# Patient Record
Sex: Female | Born: 1995 | Race: White | Hispanic: No | Marital: Single | Smoking: Never smoker
Health system: Southern US, Community
[De-identification: ages and names within clinical notes are randomized; demographics above are authoritative.]

## PROBLEM LIST (undated history)

## (undated) DIAGNOSIS — L509 Urticaria, unspecified: Secondary | ICD-10-CM

## (undated) HISTORY — DX: Urticaria, unspecified: L50.9

## (undated) HISTORY — PX: FINGER SURGERY: SHX640

---

## 2014-03-14 ENCOUNTER — Ambulatory Visit: Payer: Self-pay | Admitting: Gastroenterology

## 2014-03-31 ENCOUNTER — Ambulatory Visit: Payer: Self-pay | Admitting: Gastroenterology

## 2015-02-05 ENCOUNTER — Ambulatory Visit: Payer: Self-pay | Admitting: Family Medicine

## 2015-02-15 ENCOUNTER — Ambulatory Visit: Admit: 2015-02-15 | Disposition: A | Discharge status: Home / Self Care | Payer: Self-pay | Admitting: Family Medicine

## 2015-08-18 ENCOUNTER — Emergency Department (HOSPITAL_COMMUNITY): Admission: EM | Admit: 2015-08-18 | Discharge: 2015-08-18 | Discharge status: Absent without leave | Payer: Self-pay

## 2015-08-18 ENCOUNTER — Encounter (HOSPITAL_COMMUNITY): Payer: Self-pay | Admitting: Emergency Medicine

## 2015-08-18 ENCOUNTER — Emergency Department (HOSPITAL_COMMUNITY)
Admission: EM | Admit: 2015-08-18 | Discharge: 2015-08-18 | Disposition: A | Discharge status: Home / Self Care | Payer: BLUE CROSS/BLUE SHIELD | Attending: Emergency Medicine | Admitting: Emergency Medicine

## 2015-08-18 DIAGNOSIS — X58XXXA Exposure to other specified factors, initial encounter: Secondary | ICD-10-CM | POA: Diagnosis not present

## 2015-08-18 DIAGNOSIS — Z79899 Other long term (current) drug therapy: Secondary | ICD-10-CM | POA: Diagnosis not present

## 2015-08-18 DIAGNOSIS — Y9289 Other specified places as the place of occurrence of the external cause: Secondary | ICD-10-CM | POA: Diagnosis not present

## 2015-08-18 DIAGNOSIS — R0602 Shortness of breath: Secondary | ICD-10-CM | POA: Insufficient documentation

## 2015-08-18 DIAGNOSIS — R21 Rash and other nonspecific skin eruption: Secondary | ICD-10-CM | POA: Diagnosis present

## 2015-08-18 DIAGNOSIS — Y998 Other external cause status: Secondary | ICD-10-CM | POA: Diagnosis not present

## 2015-08-18 DIAGNOSIS — Y9389 Activity, other specified: Secondary | ICD-10-CM | POA: Diagnosis not present

## 2015-08-18 DIAGNOSIS — T7840XA Allergy, unspecified, initial encounter: Secondary | ICD-10-CM | POA: Diagnosis not present

## 2015-08-18 MED ORDER — PREDNISONE 20 MG PO TABS: ORAL_TABLET | ORAL | Status: AC

## 2015-08-18 MED ORDER — EPINEPHRINE 0.3 MG/0.3ML IJ SOAJ: 0.3000 mg | Freq: Once | INTRAMUSCULAR | Status: AC

## 2015-08-18 NOTE — ED Provider Notes (Signed)
CSN: [id number]     Arrival date & time 08/18/15  1323 History   First MD Initiated Contact with Patient 08/18/15 1458     Chief Complaint  Patient presents with  . Allergic Reaction     (Consider location/radiation/quality/duration/timing/severity/associated sxs/prior Treatment) HPI Comments: The ER as a transfer from urgent care for allergic reaction. Patient reports that she started to experience rash 5 days ago. Over the next couple of days the rash spread and worsened. She was seen at urgent care this morning for the symptoms and had sudden worsening. She started to have tightness in her chest and difficulty breathing. She was treated with Benadryl and dexamethasone approximately 3 hours ago. Symptoms are significantly improved.  Patient is a 19 y.o. female presenting with allergic reaction.  Allergic Reaction Presenting symptoms: rash     History reviewed. No pertinent past medical history. History reviewed. No pertinent past surgical history. No family history on file. Social History  Substance Use Topics  . Smoking status: Never Smoker   . Smokeless tobacco: None  . Alcohol Use: Yes     Comment: socially   OB History    No data available     Review of Systems  Respiratory: Positive for shortness of breath.   Skin: Positive for rash.  All other systems reviewed and are negative.     Allergies  Review of patient's allergies indicates no known allergies.  Home Medications   Prior to Admission medications   Medication Sig Start Date End Date Taking? Authorizing Provider  diphenhydrAMINE (BENADRYL) 25 mg capsule Take 25 mg by mouth every 6 (six) hours as needed for itching or allergies.   Yes Historical Provider, MD  escitalopram (LEXAPRO) 10 MG tablet Take 10 mg by mouth daily. 08/07/15  Yes Historical Provider, MD  IRON PO Take 1 tablet by mouth daily.   Yes Historical Provider, MD  loratadine (CLARITIN) 10 MG tablet Take 10 mg by mouth daily as needed for  allergies.   Yes Historical Provider, MD  LOW-OGESTREL 0.3-30 MG-MCG tablet Take 1 tablet by mouth daily. 06/21/15  Yes Historical Provider, MD  Probiotic Product (PROBIOTIC DAILY) CAPS Take 1 tablet by mouth daily.   Yes Historical Provider, MD  spironolactone (ALDACTONE) 100 MG tablet Take 1 tablet by mouth daily. 08/08/15  Yes Historical Provider, MD   BP 119/73 mmHg  Pulse 53  Temp(Src) 98.5 F (36.9 C) (Oral)  Resp 18  SpO2 98%  LMP 08/04/2015 (Approximate) Physical Exam  Constitutional: She is oriented to person, place, and time. She appears well-developed and well-nourished. No distress.  HENT:  Head: Normocephalic and atraumatic.  Right Ear: Hearing normal.  Left Ear: Hearing normal.  Nose: Nose normal.  Mouth/Throat: Oropharynx is clear and moist and mucous membranes are normal.  Eyes: Conjunctivae and EOM are normal. Pupils are equal, round, and reactive to light.  Neck: Normal range of motion. Neck supple.  Cardiovascular: Regular rhythm, S1 normal and S2 normal.  Exam reveals no gallop and no friction rub.   No murmur heard. Pulmonary/Chest: Effort normal and breath sounds normal. No respiratory distress. She exhibits no tenderness.  Abdominal: Soft. Normal appearance and bowel sounds are normal. There is no hepatosplenomegaly. There is no tenderness. There is no rebound, no guarding, no tenderness at McBurney's point and negative Murphy's sign. No hernia.  Musculoskeletal: Normal range of motion.  Neurological: She is alert and oriented to person, place, and time. She has normal strength. No cranial nerve deficit or sensory deficit.  Coordination normal. GCS eye subscore is 4. GCS verbal subscore is 5. GCS motor subscore is 6.  Skin: Skin is warm, dry and intact. Rash (diffuse erythematous rash) noted. No cyanosis.  Psychiatric: She has a normal mood and affect. Her speech is normal and behavior is normal. Thought content normal.  Nursing note and vitals reviewed.   ED  Course  Procedures (including critical care time) Labs Review Labs Reviewed - No data to display  Imaging Review No results found. I have personally reviewed and evaluated these images and lab results as part of my medical decision-making.   EKG Interpretation None      MDM   Final diagnoses:  None  allergic reaction  Presents to the emergency department for evaluation of allergic reaction. Patient reports that symptoms have been ongoing intermittent all week. It started after eating mango, has never had a reaction before. She describes hives intermittently over the week, worsening of symptoms this morning. This was treated at urgent care with Benadryl and Decadron. She has a nonspecific rash currently does not resemble urticaria. Symptoms definitely consistent with allergic reaction. Will continue prednisone taper. Benadryl scheduled for 2 days, then as needed. Return if symptoms worsen.    Gilda Crease, MD 08/18/15 [phone]

## 2015-08-18 NOTE — ED Notes (Signed)
Pt from UC after possible allergic reaction to mango. Pt was given 50 IM benadryl, 10 Dexamethasone at UC. Pt has hives and itching for a few days but continued to eat mango. Pt has no respiratory distress. Pt denies N/V. Pt is A&O, ambulatory w/o difficulty and in NAD

## 2015-08-18 NOTE — Discharge Instructions (Signed)
Epinephrine Injection Epinephrine is a medicine given by injection to temporarily treat an emergency allergic reaction. It is also used to treat severe asthmatic attacks and other lung problems. The medicine helps to enlarge (dilate) the small breathing tubes of the lungs. A life-threatening, sudden allergic reaction that involves the whole body is called anaphylaxis. Because of potential side effects, epinephrine should only be used as directed by your caregiver. RISKS AND COMPLICATIONS Possible side effects of epinephrine injections include:  Chest pain.  Irregular or rapid heartbeat.  Shortness of breath.  Nausea.  Vomiting.  Abdominal pain or cramping.  Sweating.  Dizziness.  Weakness.  Headache.  Nervousness. Report all side effects to your caregiver. HOW TO GIVE AN EPINEPHRINE INJECTION Give the epinephrine injection immediately when symptoms of a severe reaction begin. Inject the medicine into the outer thigh or any available, large muscle. Your caregiver can teach you how to do this. You do not need to remove any clothing. After the injection, call your local emergency services (911 in [place] Even if you improve after the injection, you need to be examined at a hospital emergency department. Epinephrine works quickly, but it also wears off quickly. Delayed reactions can occur. A delayed reaction may be as serious and dangerous as the initial reaction. HOME CARE INSTRUCTIONS  Make sure you and your family know how to give an epinephrine injection.  Use epinephrine injections as directed by your caregiver. Do not use this medicine more often or in larger doses than prescribed.  Always carry your epinephrine injection or anaphylaxis kit with you. This can be lifesaving if you have a severe reaction.  Store the medicine in a cool, dry place. If the medicine becomes discolored or cloudy, dispose of it properly and replace it with new medicine.  Check the expiration date on  your medicine. It may be unsafe to use medicines past their expiration date.  Tell your caregiver about any other medicines you are taking. Some medicines can react badly with epinephrine.  Tell your caregiver about any medical conditions you have, such as diabetes, high blood pressure (hypertension), heart disease, irregular heartbeats, or if you are pregnant. SEEK IMMEDIATE MEDICAL CARE IF:  You have used an epinephrine injection. Call your local emergency services (911 in [place] Even if you improve after the injection, you need to be examined at a hospital emergency department to make sure your allergic reaction is under control. You will also be monitored for adverse effects from the medicine.  You have chest pain.  You have irregular or fast heartbeats.  You have shortness of breath.  You have severe headaches.  You have severe nausea, vomiting, or abdominal cramps.  You have severe pain, swelling, or redness in the area where you gave the injection. Document Released: 11/14/2000 Document Revised: 02/09/2012 Document Reviewed: 08/06/2011 [facility] Patient Information 2015 [place], [place]. This information is not intended to replace advice given to you by your health care provider. Make sure you discuss any questions you have with your health care provider. Hives Hives are itchy, red, swollen areas of the skin. They can vary in size and location on your body. Hives can come and go for hours or several days (acute hives) or for several weeks (chronic hives). Hives do not spread from person to person (noncontagious). They may get worse with scratching, exercise, and emotional stress. CAUSES   Allergic reaction to food, additives, or drugs.  Infections, including the common cold.  Illness, such as vasculitis, lupus, or thyroid disease.  Exposure to sunlight, heat, or cold.  Exercise.  Stress.  Contact with chemicals. SYMPTOMS   Red or white swollen patches on the skin. The  patches may change size, shape, and location quickly and repeatedly.  Itching.  Swelling of the hands, feet, and face. This may occur if hives develop deeper in the skin. DIAGNOSIS  Your caregiver can usually tell what is wrong by performing a physical exam. Skin or blood tests may also be done to determine the cause of your hives. In some cases, the cause cannot be determined. TREATMENT  Mild cases usually get better with medicines such as antihistamines. Severe cases may require an emergency epinephrine injection. If the cause of your hives is known, treatment includes avoiding that trigger.  HOME CARE INSTRUCTIONS   Avoid causes that trigger your hives.  Take antihistamines as directed by your caregiver to reduce the severity of your hives. Non-sedating or low-sedating antihistamines are usually recommended. Do not drive while taking an antihistamine.  Take any other medicines prescribed for itching as directed by your caregiver.  Wear loose-fitting clothing.  Keep all follow-up appointments as directed by your caregiver. SEEK MEDICAL CARE IF:   You have persistent or severe itching that is not relieved with medicine.  You have painful or swollen joints. SEEK IMMEDIATE MEDICAL CARE IF:   You have a fever.  Your tongue or lips are swollen.  You have trouble breathing or swallowing.  You feel tightness in the throat or chest.  You have abdominal pain. These problems may be the first sign of a life-threatening allergic reaction. Call your local emergency services (911 in [place] MAKE SURE YOU:   Understand these instructions.  Will watch your condition.  Will get help right away if you are not doing well or get worse. Document Released: 11/17/2005 Document Revised: 11/22/2013 Document Reviewed: 02/10/2012 [facility] Patient Information 2015 [place], [place]. This information is not intended to replace advice given to you by your health care provider. Make sure you discuss any  questions you have with your health care provider.

## 2015-08-18 NOTE — ED Notes (Signed)
Bed: WA18 Expected date:  Expected time:  Means of arrival:  Comments: ems 

## 2015-08-28 ENCOUNTER — Ambulatory Visit: Payer: Self-pay | Admitting: Allergy and Immunology

## 2015-09-17 ENCOUNTER — Encounter: Payer: Self-pay | Admitting: Pediatrics

## 2015-09-17 ENCOUNTER — Ambulatory Visit (INDEPENDENT_AMBULATORY_CARE_PROVIDER_SITE_OTHER): Payer: BLUE CROSS/BLUE SHIELD | Admitting: Pediatrics

## 2015-09-17 VITALS — BP 110/64 | HR 64 | Temp 98.5°F | Resp 16 | Ht 66.54 in | Wt 148.8 lb

## 2015-09-17 DIAGNOSIS — T782XXA Anaphylactic shock, unspecified, initial encounter: Secondary | ICD-10-CM | POA: Insufficient documentation

## 2015-09-17 DIAGNOSIS — L500 Allergic urticaria: Secondary | ICD-10-CM | POA: Diagnosis not present

## 2015-09-17 DIAGNOSIS — J3089 Other allergic rhinitis: Secondary | ICD-10-CM | POA: Diagnosis not present

## 2015-09-17 MED ORDER — RANITIDINE HCL 150 MG PO TABS: 150.0000 mg | ORAL_TABLET | Freq: Two times a day (BID) | ORAL | Status: AC

## 2015-09-17 NOTE — Progress Notes (Signed)
NEW PATIENT NOTE  RE: Darlene Scott MRN: [id number] DOB: [birth year] [facility] [place] [address] [place] [address] Date of Office Visit: 09/17/2015  Assessment Referring provider: No referring provider defined for this encounter.  Chief Complaint: Allergic Reaction   HPI The patient is a 19 year old who developed hives one month ago. She used Benadryl. On September 17 of this year she developed hives and shortness of breath. She was treated with prednisone for 2 weeks and Benadryl. When she ran out of prednisone her hives returned. She has continued to have hives daily. The hives have not resolved with the use of Benadryl. At times she does have nasal congestion  She has been taking spironolactone for acne. For the past week she has been in a study looking at a supplemet called resveratrol  Review of Systems  Constitutional: Negative.   HENT: Negative.   Eyes: Negative.   Respiratory: Negative.   Cardiovascular: Negative.   Gastrointestinal: Negative.   Genitourinary: Negative.   Musculoskeletal: Negative.   Skin: Rash: urticaria since 08/11/2015 complicated by shortness of breath 1 week later.  Neurological: Negative.   Endo/Heme/Allergies: Negative.   Psychiatric/Behavioral: Negative.        She has ADD     Drug Allergies:  No Known Allergies  Physical Exam: BP 110/64 mmHg  Pulse 64  Temp(Src) 98.5 F (36.9 C)  Resp 16  Ht 5' 6.53" (1.69 m)  Wt 148 lb 13 oz (67.5 kg)  BMI 23.63 kg/m2  LMP 09/03/2015  Physical Exam  Constitutional: She is oriented to person, place, and time. She appears well-developed and well-nourished.  HENT:  Eyes normal. Ears normal. Nose normal. Pharynx normal.  Neck: Neck supple. No thyromegaly present.  Cardiovascular:  S1 and S2 normal no murmurs  Pulmonary/Chest:  Clear to percussion and auscultation  Abdominal: Soft.  No hepatosplenomegaly  Lymphadenopathy:     She has no cervical adenopathy.  Neurological: She is alert and oriented to person, place, and time.  Skin:  She had multiple hives. She also has dermographia  Psychiatric: She has a normal mood and affect. Her behavior is normal. Thought content normal.     Diagnostics:  Allergy skin testing did not show a significant reaction to a food. She had a positive skin test to dust mite   Assessment and Plan: 1. Anaphylaxis, initial encounter   2. Other allergic rhinitis   3. Allergic urticaria    Meds ordered this encounter  Medications  . ranitidine (ZANTAC) 150 MG tablet    Sig: Take 1 tablet (150 mg total) by mouth 2 (two) times daily.    Dispense:  60 tablet    Refill:  5   Patient Instructions  Cetirizine 10 mg twice a day for itching Ranitidine 150 mg twice a day She will see if foods that contain salicylates makes her itch If she has an allergic reaction she will take Benadryl 50 mg every 4 hours and if she has life-threatening symptoms she will inject herself with EpiPen 0.3 mg .She will then write what she had to eat or drink in the previous 4 hours If her symptoms get worse she will add prednisone 10 mg twice a day for 4 days 10 mg on the fifth day. She will let me know if she has to use prednisone.  Follow-up in 6 weeks but she will call me she's not doing well on this treatment plan.  Return in about 6 weeks (around 10/29/2015).   Thank you for the opportunity to care for this patient.  Please do not hesitate to contact me with questions.  [facility] [address] [place], [place] [id number] [phone]  J. Posey ReaA. Bardelas, M.D.

## 2015-09-17 NOTE — Patient Instructions (Addendum)
Cetirizine 10 mg twice a day for itching Ranitidine 150 mg twice a day She will see if foods that contain salicylates makes her itch If she has an allergic reaction she will take Benadryl 50 mg every 4 hours and if she has life-threatening symptoms she will inject herself with EpiPen 0.3 mg .She will then write what she had to eat or drink in the previous 4 hours If her symptoms get worse she will add prednisone 10 mg twice a day for 4 days 10 mg on the fifth day. She will let me know if she has to use prednisone.  Follow-up in 6 weeks but she will call me she's not doing well on this treatment plan.

## 2015-10-07 IMAGING — CR RIGHT TIBIA AND FIBULA - 2 VIEW
1 series · 4 of 4 positions shown · non-contrast
Comparison: None.

CLINICAL DATA: Leg pain for 5 weeks.

EXAM:
RIGHT TIBIA AND FIBULA - 2 VIEW

[Series 1: kdxr tibia and fibula rt(low leg · 0.14mm/px · 4 of 4 slices shown]
[im 1/4]
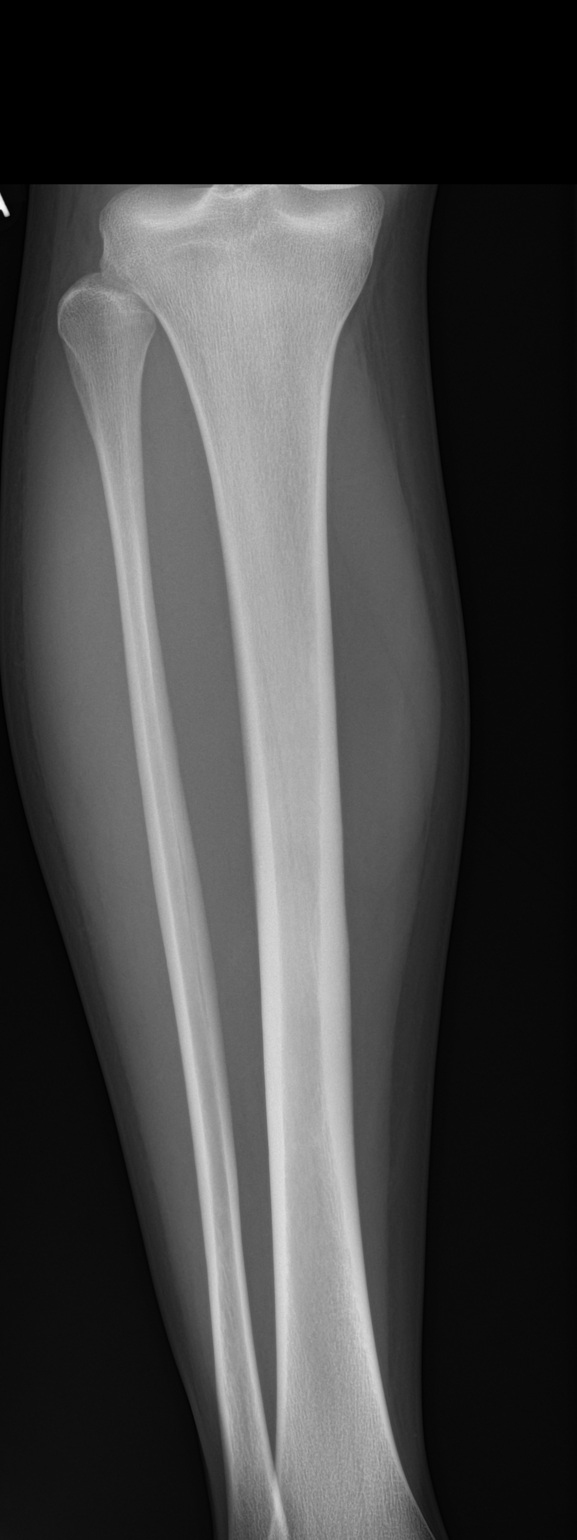
[im 2/4]
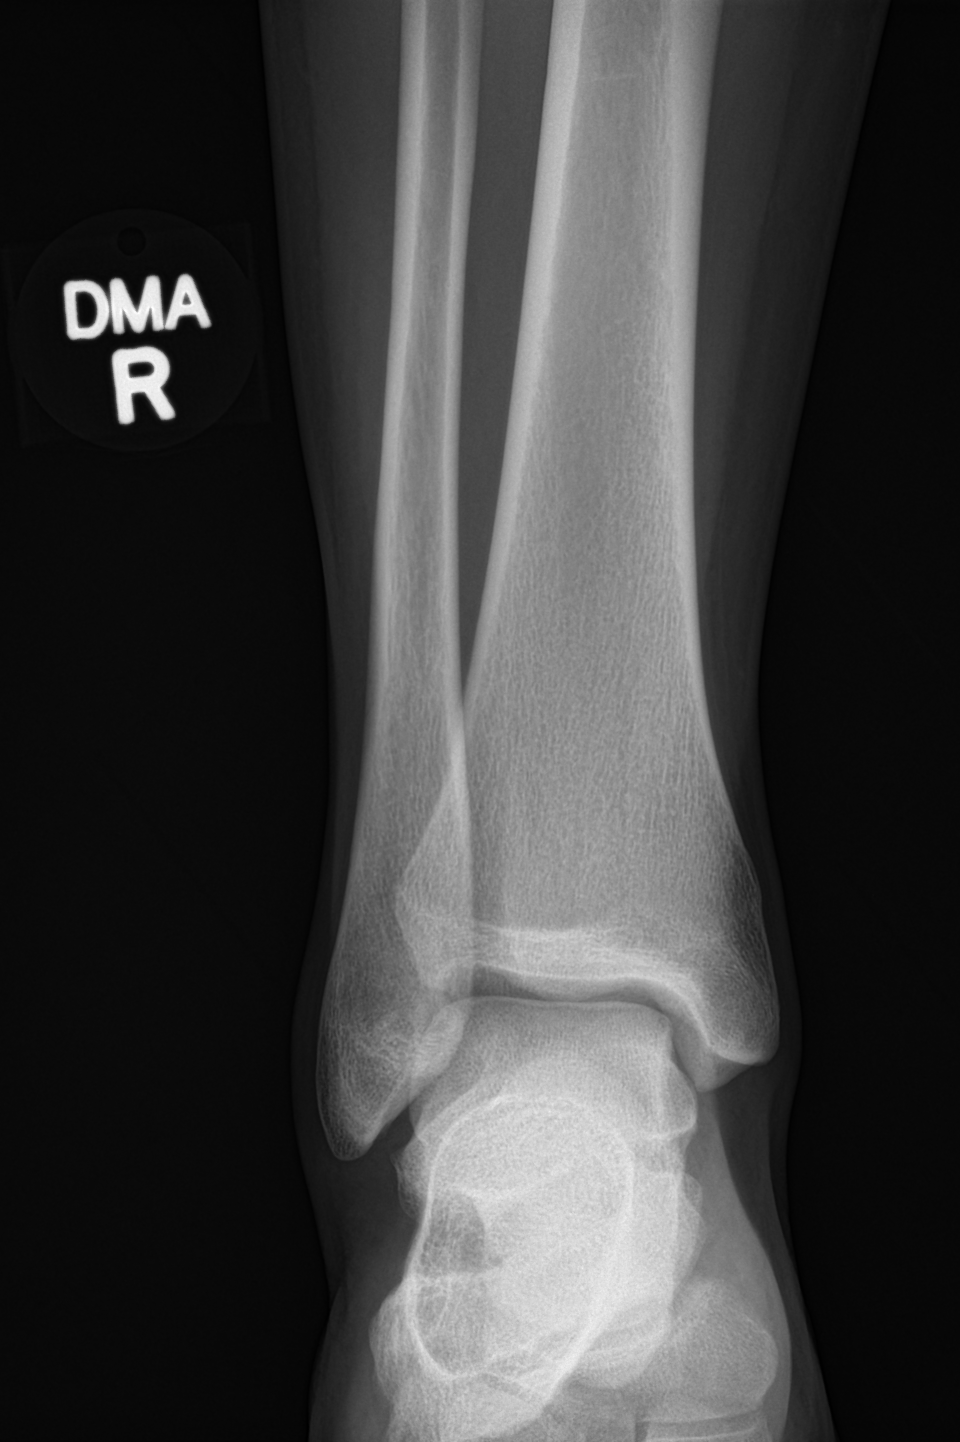
[im 3/4]
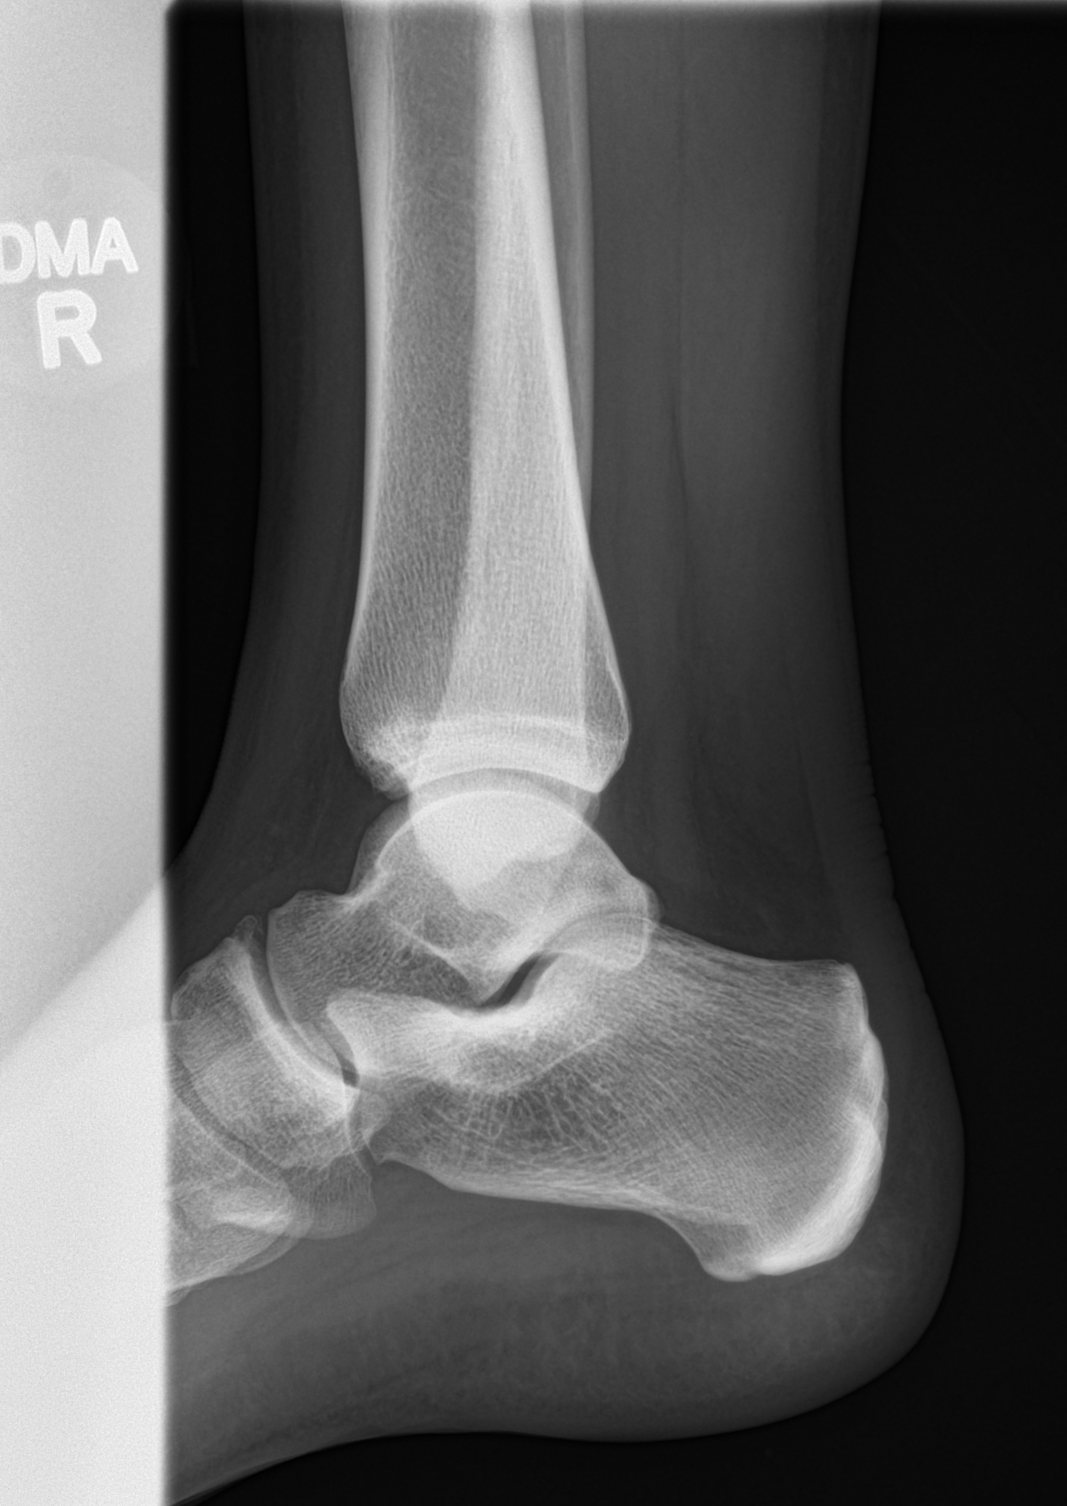
[im 4/4]
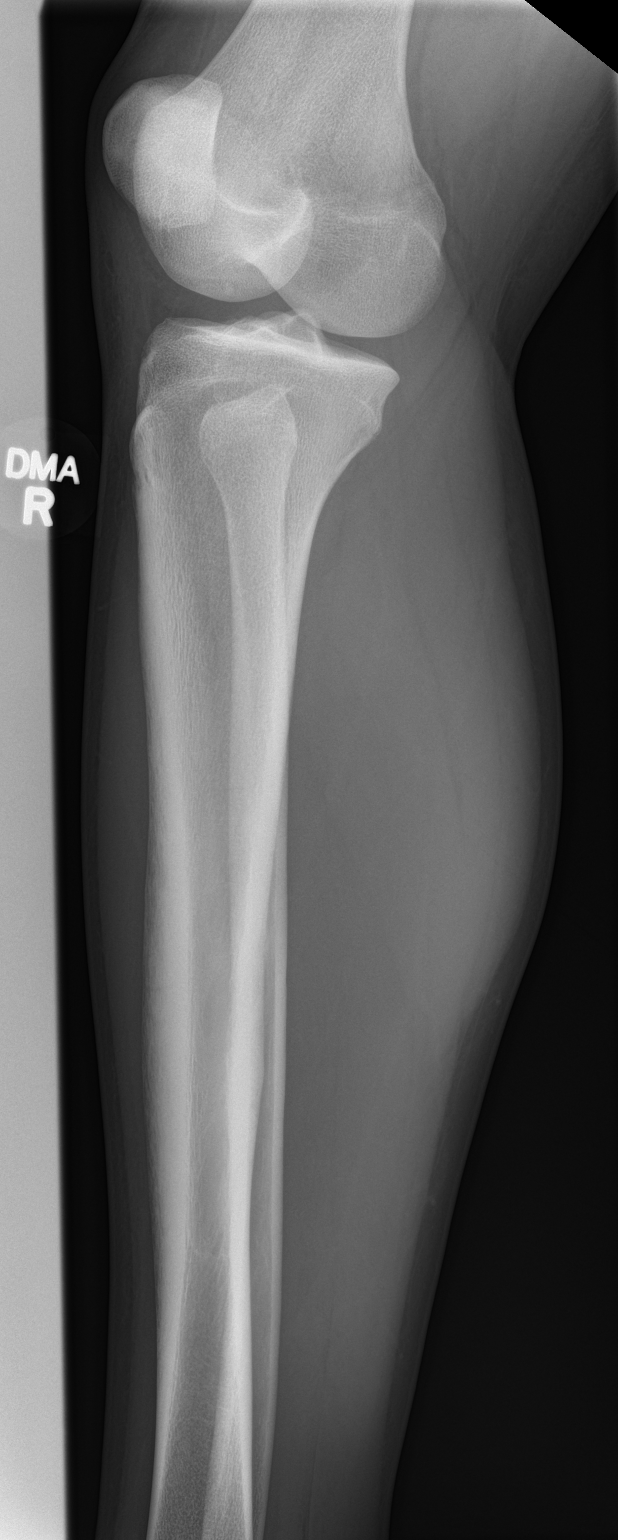

[4 of 4 positions shown; findings below may reference images not displayed]

FINDINGS: No acute fracture. No dislocation. No obvious focal sclerosis to
suggest stress fracture. Joint spaces are maintained. Unremarkable
soft tissues.
IMPRESSION: No acute bony pathology.
